# Patient Record
Sex: Female | Born: 1965 | Race: White | Hispanic: No | Marital: Single | Smoking: Never smoker
Health system: Southern US, Community
[De-identification: ages and names within clinical notes are randomized; demographics above are authoritative.]

## PROBLEM LIST (undated history)

## (undated) DIAGNOSIS — C801 Malignant (primary) neoplasm, unspecified: Secondary | ICD-10-CM

## (undated) DIAGNOSIS — I10 Essential (primary) hypertension: Secondary | ICD-10-CM

## (undated) HISTORY — PX: ERCP: SHX60

## (undated) HISTORY — PX: CHOLECYSTECTOMY: SHX55

---

## 2015-03-21 ENCOUNTER — Other Ambulatory Visit: Payer: Self-pay | Admitting: Family Medicine

## 2015-03-21 DIAGNOSIS — Z1231 Encounter for screening mammogram for malignant neoplasm of breast: Secondary | ICD-10-CM

## 2015-03-31 ENCOUNTER — Ambulatory Visit
Admission: RE | Admit: 2015-03-31 | Discharge: 2015-03-31 | Disposition: A | Discharge status: Home / Self Care | Payer: BC Managed Care – PPO | Source: Ambulatory Visit | Attending: Family Medicine | Admitting: Family Medicine

## 2015-03-31 DIAGNOSIS — Z1231 Encounter for screening mammogram for malignant neoplasm of breast: Secondary | ICD-10-CM | POA: Insufficient documentation

## 2015-03-31 HISTORY — DX: Malignant (primary) neoplasm, unspecified: C80.1

## 2016-07-08 ENCOUNTER — Other Ambulatory Visit: Payer: Self-pay | Admitting: Family Medicine

## 2016-07-08 DIAGNOSIS — Z1231 Encounter for screening mammogram for malignant neoplasm of breast: Secondary | ICD-10-CM

## 2016-07-26 ENCOUNTER — Ambulatory Visit
Admission: RE | Admit: 2016-07-26 | Discharge: 2016-07-26 | Disposition: A | Discharge status: Home / Self Care | Payer: BC Managed Care – PPO | Source: Ambulatory Visit | Attending: Family Medicine | Admitting: Family Medicine

## 2016-07-26 DIAGNOSIS — Z1231 Encounter for screening mammogram for malignant neoplasm of breast: Secondary | ICD-10-CM | POA: Diagnosis not present

## 2017-01-03 ENCOUNTER — Emergency Department: Payer: BC Managed Care – PPO

## 2017-01-03 ENCOUNTER — Emergency Department
Admission: EM | Admit: 2017-01-03 | Discharge: 2017-01-03 | Disposition: A | Discharge status: Home / Self Care | Payer: BC Managed Care – PPO | Attending: Emergency Medicine | Admitting: Emergency Medicine

## 2017-01-03 DIAGNOSIS — Y999 Unspecified external cause status: Secondary | ICD-10-CM | POA: Diagnosis not present

## 2017-01-03 DIAGNOSIS — S199XXA Unspecified injury of neck, initial encounter: Secondary | ICD-10-CM | POA: Diagnosis present

## 2017-01-03 DIAGNOSIS — Y939 Activity, unspecified: Secondary | ICD-10-CM | POA: Diagnosis not present

## 2017-01-03 DIAGNOSIS — S161XXA Strain of muscle, fascia and tendon at neck level, initial encounter: Secondary | ICD-10-CM | POA: Insufficient documentation

## 2017-01-03 DIAGNOSIS — S39012A Strain of muscle, fascia and tendon of lower back, initial encounter: Secondary | ICD-10-CM | POA: Insufficient documentation

## 2017-01-03 DIAGNOSIS — Y92410 Unspecified street and highway as the place of occurrence of the external cause: Secondary | ICD-10-CM | POA: Insufficient documentation

## 2017-01-03 MED ORDER — KETOROLAC TROMETHAMINE 60 MG/2ML IM SOLN
60.0000 mg | Freq: Once | INTRAMUSCULAR | Status: AC
  Administered 2017-01-03: 60 mg via INTRAMUSCULAR
  Filled 2017-01-03: qty 2

## 2017-01-03 MED ORDER — MELOXICAM 15 MG PO TABS: 15.0000 mg | ORAL_TABLET | Freq: Every day | ORAL | 0 refills | Status: AC

## 2017-01-03 MED ORDER — METHOCARBAMOL 500 MG PO TABS: 500.0000 mg | ORAL_TABLET | Freq: Four times a day (QID) | ORAL | 0 refills | Status: AC

## 2017-01-03 MED ORDER — ORPHENADRINE CITRATE 30 MG/ML IJ SOLN
60.0000 mg | Freq: Once | INTRAMUSCULAR | Status: AC
  Administered 2017-01-03: 60 mg via INTRAMUSCULAR
  Filled 2017-01-03: qty 2

## 2017-01-03 NOTE — ED Notes (Signed)
Pt states right thigh is tingling feels like it is asleep.
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
## 2017-04-12 ENCOUNTER — Encounter: Payer: Self-pay | Admitting: Emergency Medicine

## 2017-04-12 ENCOUNTER — Other Ambulatory Visit: Payer: Self-pay

## 2017-04-12 ENCOUNTER — Inpatient Hospital Stay
Admission: EM | Admit: 2017-04-12 | Discharge: 2017-04-12 | DRG: 445 | Disposition: A | Discharge status: Other Acute Inpatient Hospital | Payer: BC Managed Care – PPO | Attending: Surgery | Admitting: Surgery

## 2017-04-12 ENCOUNTER — Emergency Department: Payer: BC Managed Care – PPO

## 2017-04-12 DIAGNOSIS — K819 Cholecystitis, unspecified: Secondary | ICD-10-CM

## 2017-04-12 DIAGNOSIS — K3533 Acute appendicitis with perforation and localized peritonitis, with abscess: Secondary | ICD-10-CM | POA: Diagnosis present

## 2017-04-12 DIAGNOSIS — Z6841 Body Mass Index (BMI) 40.0 and over, adult: Secondary | ICD-10-CM | POA: Diagnosis not present

## 2017-04-12 DIAGNOSIS — Z85828 Personal history of other malignant neoplasm of skin: Secondary | ICD-10-CM

## 2017-04-12 DIAGNOSIS — K8051 Calculus of bile duct without cholangitis or cholecystitis with obstruction: Principal | ICD-10-CM | POA: Diagnosis present

## 2017-04-12 DIAGNOSIS — R1011 Right upper quadrant pain: Secondary | ICD-10-CM | POA: Diagnosis present

## 2017-04-12 DIAGNOSIS — Z79899 Other long term (current) drug therapy: Secondary | ICD-10-CM | POA: Diagnosis not present

## 2017-04-12 LAB — COMPREHENSIVE METABOLIC PANEL
ALT: 301 U/L — ABNORMAL HIGH (ref 14–54)
AST: 163 U/L — ABNORMAL HIGH (ref 15–41)
Albumin: 3.5 g/dL (ref 3.5–5.0)
Alkaline Phosphatase: 156 U/L — ABNORMAL HIGH (ref 38–126)
Anion gap: 11 (ref 5–15)
BUN: 13 mg/dL (ref 6–20)
CO2: 24 mmol/L (ref 22–32)
Calcium: 9 mg/dL (ref 8.9–10.3)
Chloride: 102 mmol/L (ref 101–111)
Creatinine, Ser: 0.84 mg/dL (ref 0.44–1.00)
GFR calc Af Amer: >60 mL/min (ref >60)
GFR calc non Af Amer: >60 mL/min (ref >60)
Glucose, Bld: 144 mg/dL — ABNORMAL HIGH (ref 65–99)
Potassium: 2.8 mmol/L — ABNORMAL LOW (ref 3.5–5.1)
Sodium: 137 mmol/L (ref 135–145)
Total Bilirubin: 2.5 mg/dL — ABNORMAL HIGH (ref 0.3–1.2)
Total Protein: 7.8 g/dL (ref 6.5–8.1)

## 2017-04-12 LAB — CBC WITH DIFFERENTIAL/PLATELET
Basophils Absolute: 0 10*3/uL (ref 0–0.1)
Basophils Relative: 0 %
Eosinophils Absolute: 0 10*3/uL (ref 0–0.7)
Eosinophils Relative: 0 %
HCT: 39.4 % (ref 35.0–47.0)
Hemoglobin: 12.9 g/dL (ref 12.0–16.0)
Lymphocytes Relative: 9 %
Lymphs Abs: 0.7 10*3/uL — ABNORMAL LOW (ref 1.0–3.6)
MCH: 28.6 pg (ref 26.0–34.0)
MCHC: 32.8 g/dL (ref 32.0–36.0)
MCV: 87.3 fL (ref 80.0–100.0)
Monocytes Absolute: 0.5 10*3/uL (ref 0.2–0.9)
Monocytes Relative: 6 %
Neutro Abs: 6.5 10*3/uL (ref 1.4–6.5)
Neutrophils Relative %: 85 %
Platelets: 256 10*3/uL (ref 150–440)
RBC: 4.52 MIL/uL (ref 3.80–5.20)
RDW: 14 % (ref 11.5–14.5)
WBC: 7.7 10*3/uL (ref 3.6–11.0)

## 2017-04-12 LAB — LIPASE, BLOOD: Lipase: 3474 U/L — ABNORMAL HIGH (ref 11–51)

## 2017-04-12 LAB — TROPONIN I: Troponin I: <0.03 ng/mL (ref <0.03)

## 2017-04-12 LAB — LACTIC ACID, PLASMA: Lactic Acid, Venous: 1.6 mmol/L (ref 0.5–1.9)

## 2017-04-12 MED ORDER — SODIUM CHLORIDE 0.9 % IV BOLUS (SEPSIS)
1000.0000 mL | Freq: Once | INTRAVENOUS | Status: AC
  Administered 2017-04-12: 1000 mL via INTRAVENOUS

## 2017-04-12 MED ORDER — PROMETHAZINE HCL 25 MG/ML IJ SOLN
12.5000 mg | Freq: Once | INTRAMUSCULAR | Status: AC
  Administered 2017-04-12: 12.5 mg via INTRAVENOUS
  Filled 2017-04-12: qty 1

## 2017-04-12 MED ORDER — METRONIDAZOLE IN NACL 5-0.79 MG/ML-% IV SOLN
500.0000 mg | Freq: Once | INTRAVENOUS | Status: AC
  Administered 2017-04-12: 500 mg via INTRAVENOUS
  Filled 2017-04-12: qty 100

## 2017-04-12 MED ORDER — MORPHINE SULFATE (PF) 4 MG/ML IV SOLN
4.0000 mg | Freq: Once | INTRAVENOUS | Status: AC
  Administered 2017-04-12: 4 mg via INTRAVENOUS
  Filled 2017-04-12: qty 1

## 2017-04-12 MED ORDER — PROMETHAZINE HCL 25 MG/ML IJ SOLN
INTRAMUSCULAR | Status: AC
  Administered 2017-04-12: 12.5 mg via INTRAVENOUS
  Filled 2017-04-12: qty 1

## 2017-04-12 MED ORDER — ONDANSETRON HCL 4 MG/2ML IJ SOLN
4.0000 mg | Freq: Once | INTRAMUSCULAR | Status: AC
  Administered 2017-04-12: 4 mg via INTRAVENOUS
  Filled 2017-04-12: qty 2

## 2017-04-12 MED ORDER — CIPROFLOXACIN IN D5W 400 MG/200ML IV SOLN
400.0000 mg | Freq: Once | INTRAVENOUS | Status: AC
  Administered 2017-04-12: 400 mg via INTRAVENOUS
  Filled 2017-04-12: qty 200

## 2017-04-12 NOTE — ED Triage Notes (Signed)
Pt to ed with c/o right side upper abd pain.  States had labs drawn at MD office yesterday.  Reports elevated liver enzymes.

## 2017-04-12 NOTE — ED Notes (Signed)
Patient left via [employer]

## 2017-04-12 NOTE — ED Notes (Signed)
emtala reviewed by this RN 

## 2017-04-12 NOTE — Consult Note (Signed)
Patient ID: Eileen Wu, female   DOB: 08/14/1965, 52 y.o.   MRN: [id number]  HPI Eileen Wu is a 52 y.o. female orbitally obese female presented with an acute onset of right upper quadrant and epigastric pain that is started 3 days ago.  The patient reports that the pain is worsening over the last 2 days and now is severe and constant.  She went to [facility] clinic yesterday where they had LFTs done revealing increasing the bilirubin, alkaline phosphatase and LFTs.  Moreover she reports that over the last couple of days she had acholia and darker urine.  She developed fevers and chills.  She is super morbidly obese with a BMI of 46 but she is able to perform more than 4 metastases of activity without any shortness of breath or chest pain.  She does have an undiagnosed and untreated sleep apnea. Ultrasound personally reviewed there is evidence of cholecystitis with gallstones.  Normal common bile duct.  Repeated LFTs show worsening in the bilirubin as well as increase in the alkaline phosphatase.  HPI  Past Medical History:  Diagnosis Date  . Cancer ([place])    skin    History reviewed. No pertinent surgical history.  Family History  Problem Relation Age of Onset  . Breast cancer Maternal Aunt     Social History Social History   Tobacco Use  . Smoking status: Never Smoker  . Smokeless tobacco: Never Used  Substance Use Topics  . Alcohol use: No    Frequency: Never  . Drug use: No    Allergies  Allergen Reactions  . Penicillins Hives    Has patient had a PCN reaction causing immediate rash, facial/tongue/throat swelling, SOB or lightheadedness with hypotension: Unknown Has patient had a PCN reaction causing severe rash involving mucus membranes or skin necrosis: Unknown Has patient had a PCN reaction that required hospitalization: Unknown Has patient had a PCN reaction occurring within the last 10 years: Unknown If all of the above answers are "NO", then may proceed with Cephalosporin  use.   . Sulfa Antibiotics     Current Facility-Administered Medications  Medication Dose Route Frequency Provider Last Rate Last Dose  . ciprofloxacin (CIPRO) IVPB 400 mg  400 mg Intravenous Once Nena Polio, MD 200 mL/hr at 04/12/17 1009 400 mg at 04/12/17 1009  . metroNIDAZOLE (FLAGYL) IVPB 500 mg  500 mg Intravenous Once Nena Polio, MD 100 mL/hr at 04/12/17 1022 500 mg at 04/12/17 1022  . morphine 4 MG/ML injection 4 mg  4 mg Intravenous Once Conni Slipper F, MD      . sodium chloride 0.9 % bolus 1,000 mL  1,000 mL Intravenous Once Dagon Budai F, MD       Current Outpatient Medications  Medication Sig Dispense Refill  . hydrochlorothiazide (HYDRODIURIL) 25 MG tablet Take 25 mg by mouth daily.    Marland Kitchen losartan (COZAAR) 25 MG tablet Take 25 mg by mouth daily.    . meloxicam (MOBIC) 15 MG tablet Take 1 tablet (15 mg total) by mouth daily. (Patient not taking: Reported on 04/12/2017) 30 tablet 0  . methocarbamol (ROBAXIN) 500 MG tablet Take 1 tablet (500 mg total) by mouth 4 (four) times daily. (Patient not taking: Reported on 04/12/2017) 16 tablet 0     Review of Systems Full ROS  was asked and was negative except for the information on the HPI  Physical Exam Blood pressure 115/82, pulse 65, temperature 98.3 F (36.8 C), temperature source Oral, resp. rate 16,  height 5\' 7"  (1.702 m), weight 132.9 kg (293 lb), last menstrual period 03/11/2017, SpO2 98 %. CONSTITUTIONAL: Obese in pain EYES: Pupils are equal, round, and reactive to light, Sclera are non-icteric. EARS, NOSE, MOUTH AND THROAT: The oropharynx is clear. The oral mucosa is pink and moist. Hearing is intact to voice. LYMPH NODES:  Lymph nodes in the neck are normal. RESPIRATORY:  Lungs are clear. There is normal respiratory effort, with equal breath sounds bilaterally, and without pathologic use of accessory muscles. CARDIOVASCULAR: Heart is regular without murmurs, gallops, or rubs. GI: The abdomen is  Soft, slightly  tender to palpation in the right upper quadrant positive Murphy sign.  No evidence of peritonitis  Large panus GU: Rectal deferred.   MUSCULOSKELETAL: Normal muscle strength and tone. No cyanosis or edema.   SKIN: Turgor is good and there are no pathologic skin lesions or ulcers. NEUROLOGIC: Motor and sensation is grossly normal. Cranial nerves are grossly intact. PSYCH:  Oriented to person, place and time. Affect is normal.  Data Reviewed  I have personally reviewed the patient's imaging, laboratory findings and medical records.    Assessment/Plan   52 year old female with classic signs and symptoms of acute cholecystitis with biliary obstruction confirmed by elevation of alkaline phosphatase, bilirubin and LFTs. Unfortunately Dr.Wohl is the only one that does ERCPs at [facility] is not a call over the weekend therefore this patient will need to be transferred to a center that provides an ERCP in an expedited fashion. Just with the patient and with Dr. Rip Harbour in detail about my thought process. He will transfer the patient either to[facility] in [place] or to [place]. We Have started aggressive fluid resuscitation and broad-spectrum antibiotics Need for emergent surgical intervention at this time. Please note that today's encounter require high complexity medical decision making process due to her acuity of her disease, need for transferring to a tertiary facility and assessment for the need for emergent surgical intervention.     Caroleen Hamman, MD FACS General Surgeon 04/12/2017, 10:45 AM

## 2017-04-12 NOTE — Discharge Instructions (Signed)
NOT PRINTED FOR PATIENT. PRINTED DUE TO HARD STOP IN Epic SOFTWARE WHICH DID NOT ALLOW PATIENT TO BE DISCHARGED FROM THE BOARD.

## 2017-04-12 NOTE — ED Notes (Signed)
Report given to [place] at [facility].

## 2017-04-12 NOTE — ED Notes (Signed)
[facility] here to transfer the patient to [facility]. Patient signed paper copy to give consent for transfer. Patient ambulated with a steady gait to room commode. Patient declined pain medicine for discharge. Patient is calm and cooperative at this time. Family at bedside.

## 2017-04-12 NOTE — ED Notes (Addendum)
Report given to [employer]. ETA approximately 30 minutes.

## 2017-04-12 NOTE — ED Notes (Signed)
Report given to transfer center for [place].

## 2017-04-12 NOTE — ED Provider Notes (Addendum)
[facility] Emergency Department Provider Note   ____________________________________________   First MD Initiated Contact with Patient 04/12/17 [phone]     (approximate)  I have reviewed the triage vital signs and the nursing notes.   HISTORY  Chief Complaint Abdominal Pain    HPI Eileen Wu is a 52 y.o. female Who reports she's had right upper quadrant pain since Wednesday. pain is dull and achy. She went to see the doctor yesterday due to liver function test and found that were elevated. She denies any alcohol use she denies using more than just a few Tylenol pills. She does get ultrasound today. She developed sudden onset of severe pain in right upper quadrant this morning and came in here. Her doctor had called to request an ultrasound.  review of her records show that she has had paler stool and darker urine recently. She has a history of obesity and hypertension. Past Medical History:  Diagnosis Date  . Cancer ([place])    skin    There are no active problems to display for this patient.   History reviewed. No pertinent surgical history.  Prior to Admission medications   Medication Sig Start Date End Date Taking? Authorizing Provider  meloxicam (MOBIC) 15 MG tablet Take 1 tablet (15 mg total) by mouth daily. 01/03/17   Cuthriell, Charline Bills, PA-C  methocarbamol (ROBAXIN) 500 MG tablet Take 1 tablet (500 mg total) by mouth 4 (four) times daily. 01/03/17   Cuthriell, Charline Bills, PA-C    Allergies Penicillins and Sulfa antibiotics  Family History  Problem Relation Age of Onset  . Breast cancer Maternal Aunt   family history significant for multiple relatives with gallbladder disease.  Social History Social History   Tobacco Use  . Smoking status: Never Smoker  . Smokeless tobacco: Never Used  Substance Use Topics  . Alcohol use: No    Frequency: Never  . Drug use: No    Review of Systems  Constitutional: No fever/chills Eyes: No  visual changes. ENT: No sore throat. Cardiovascular: Denies chest pain. Respiratory: Denies shortness of breath. Gastrointestinal: right upper quadrant abdominal pain nausea, no vomiting.  No diarrhea.  No constipation. Genitourinary: Negative for dysuria. Musculoskeletal: Negative for back pain. Skin: Negative for rash. Neurological: Negative for headaches, focal weakness   ____________________________________________   PHYSICAL EXAM:  VITAL SIGNS: ED Triage Vitals  Enc Vitals Group     BP 04/12/17 0743 115/82     Pulse Rate 04/12/17 0743 65     Resp 04/12/17 0743 16     Temp 04/12/17 0743 98.3 F (36.8 C)     Temp Source 04/12/17 0743 Oral     SpO2 04/12/17 0743 98 %     Weight 04/12/17 0745 293 lb (132.9 kg)     Height 04/12/17 0745 5\' 7"  (1.702 m)     Head Circumference --      Peak Flow --      Pain Score 04/12/17 0745 10     Pain Loc --      Pain Edu? --      Excl. in [place]? --    Constitutional: Alert and oriented. Ill appearing and in  acute distress. Eyes: Conjunctivae are normal.  Head: Atraumatic. Nose: No congestion/rhinnorhea. Mouth/Throat: Mucous membranes are moist.  Oropharynx non-erythematous. Neck: No stridor.  Cardiovascular: Normal rate, regular rhythm. Grossly normal heart sounds.  Good peripheral circulation. Respiratory: Normal respiratory effort.  No retractions. Lungs CTAB. Gastrointestinal: Soft tender to palpation percussion in  right upper quadrant. No distention. No abdominal bruits. No CVA tenderness. Musculoskeletal: No lower extremity tenderness nor edema.  No joint effusions. Neurologic:  Normal speech and language. No gross focal neurologic deficits are appreciated.  Skin:  Skin is warm, dry and intact. No rash noted. Psychiatric: Mood and affect are normal. Speech and behavior are normal.  ____________________________________________   LABS (all labs ordered are listed, but only abnormal results are displayed)  Labs Reviewed  CBC  WITH DIFFERENTIAL/PLATELET - Abnormal; Notable for the following components:      Result Value   Lymphs Abs 0.7 (*)    All other components within normal limits  LACTIC ACID, PLASMA  COMPREHENSIVE METABOLIC PANEL  LIPASE, BLOOD  LACTIC ACID, PLASMA  TROPONIN I   ____________________________________________  EKG EKG read and interpreted by me shows normal sinus rhythm rate of 70 normal axis no acute ST-T wave changes ____________________________________________  RADIOLOGY  ED MD interpretation:  ultrasound read by radiology as acute cholecystitis  Official radiology report(s): US Abdomen Limited  Result Date: 04/12/2017 CLINICAL DATA:  Elevated LFTs.  Epigastric pain. EXAM: ULTRASOUND ABDOMEN LIMITED RIGHT UPPER QUADRANT COMPARISON:  None. FINDINGS: Gallbladder: There is cholelithiasis. The gallbladder is mildly thickened measuring 3.5 mm. A positive Murphy's sign is reported. No pericholecystic fluid. Common bile duct: Diameter: 5 mm Liver: Hepatic steatosis. No focal mass. Portal vein is patent on color Doppler imaging with normal direction of blood flow towards the liver. IMPRESSION: 1. Cholelithiasis, mild gallbladder wall thickening, and a Murphy's sign. The findings are concerning for acute cholecystitis. 2. Hepatic steatosis. Electronically Signed   By: Dorise Bullion III M.D   On: 04/12/2017 09:38    ____________________________________________   PROCEDURES  Procedure(s) performed:   Procedures  Critical Care performed:   ____________________________________________   INITIAL IMPRESSION / ASSESSMENT AND PLAN / ED COURSE  patient has not eaten since yesterday drank a little bit of ginger ale is morning but has vomited up. She is still having pain.liver functions are still pending  Dr. Adora Fridge comes down to see the patient but we do not have anybody onll who can do ERCP therefore he asked me to tr caa   nsfer the patient patient asked to go to [facility] or if they don't  have any beds then [facility] and if they don't have any beds then [facility].   [facility] accepted her but doesn't have any beds. I called [place] factor mmHg or D will see her there and consult but I have to get the hospitalist to call me back to accept her in transfer   ____________________________________________   FINAL CLINICAL IMPRESSION(S) / ED DIAGNOSES  Final diagnoses:  Cholecystitis     ED Discharge Orders    None       Note:  This document was prepared using Dragon voice recognition software and may include unintentional dictation errors.    Nena Polio, MD 04/12/17 1000    Nena Polio, MD 04/12/17 1113    Nena Polio, MD 04/12/17 [phone]

## 2017-04-12 NOTE — ED Notes (Signed)
Patient given lemon swabs for comfort. 

## 2017-04-12 NOTE — ED Notes (Signed)
Approximately one hour before [facility] transport arrives.

## 2017-04-23 ENCOUNTER — Ambulatory Visit
Admission: RE | Admit: 2017-04-23 | Payer: BC Managed Care – PPO | Source: Ambulatory Visit | Admitting: Internal Medicine

## 2017-04-23 ENCOUNTER — Encounter: Admission: RE | Payer: Self-pay | Source: Ambulatory Visit

## 2017-04-23 SURGERY — COLONOSCOPY WITH PROPOFOL
Anesthesia: General

## 2017-05-23 ENCOUNTER — Other Ambulatory Visit: Payer: Self-pay | Admitting: Family Medicine

## 2017-05-23 ENCOUNTER — Ambulatory Visit
Admission: RE | Admit: 2017-05-23 | Discharge: 2017-05-23 | Disposition: A | Discharge status: Home / Self Care | Payer: BC Managed Care – PPO | Source: Ambulatory Visit | Attending: Family Medicine | Admitting: Family Medicine

## 2017-05-23 DIAGNOSIS — W231XXA Caught, crushed, jammed, or pinched between stationary objects, initial encounter: Secondary | ICD-10-CM | POA: Insufficient documentation

## 2017-05-23 DIAGNOSIS — S61211A Laceration without foreign body of left index finger without damage to nail, initial encounter: Secondary | ICD-10-CM

## 2017-05-23 DIAGNOSIS — M7989 Other specified soft tissue disorders: Secondary | ICD-10-CM | POA: Diagnosis not present

## 2017-11-03 ENCOUNTER — Other Ambulatory Visit: Payer: Self-pay | Admitting: Family Medicine

## 2017-11-03 ENCOUNTER — Other Ambulatory Visit: Payer: Self-pay | Admitting: Obstetrics and Gynecology

## 2017-11-03 DIAGNOSIS — Z1231 Encounter for screening mammogram for malignant neoplasm of breast: Secondary | ICD-10-CM

## 2017-11-05 ENCOUNTER — Ambulatory Visit
Admission: RE | Admit: 2017-11-05 | Discharge: 2017-11-05 | Disposition: A | Discharge status: Home / Self Care | Payer: BC Managed Care – PPO | Source: Ambulatory Visit | Attending: Obstetrics and Gynecology | Admitting: Obstetrics and Gynecology

## 2017-11-05 DIAGNOSIS — Z1231 Encounter for screening mammogram for malignant neoplasm of breast: Secondary | ICD-10-CM | POA: Insufficient documentation

## 2018-02-17 ENCOUNTER — Encounter: Payer: Self-pay | Admitting: Student

## 2018-02-18 ENCOUNTER — Ambulatory Visit
Admission: RE | Admit: 2018-02-18 | Discharge: 2018-02-18 | Disposition: A | Discharge status: Home / Self Care | Payer: BC Managed Care – PPO | Attending: Internal Medicine | Admitting: Internal Medicine

## 2018-02-18 ENCOUNTER — Ambulatory Visit: Payer: BC Managed Care – PPO | Admitting: Anesthesiology

## 2018-02-18 ENCOUNTER — Encounter
Admission: RE | Disposition: A | Discharge status: Home / Self Care | Payer: Self-pay | Source: Home / Self Care | Attending: Internal Medicine

## 2018-02-18 DIAGNOSIS — Z1211 Encounter for screening for malignant neoplasm of colon: Secondary | ICD-10-CM | POA: Diagnosis not present

## 2018-02-18 DIAGNOSIS — Z882 Allergy status to sulfonamides status: Secondary | ICD-10-CM | POA: Diagnosis not present

## 2018-02-18 DIAGNOSIS — Z791 Long term (current) use of non-steroidal anti-inflammatories (NSAID): Secondary | ICD-10-CM | POA: Insufficient documentation

## 2018-02-18 DIAGNOSIS — Z6841 Body Mass Index (BMI) 40.0 and over, adult: Secondary | ICD-10-CM | POA: Insufficient documentation

## 2018-02-18 DIAGNOSIS — I10 Essential (primary) hypertension: Secondary | ICD-10-CM | POA: Diagnosis not present

## 2018-02-18 DIAGNOSIS — Z86010 Personal history of colonic polyps: Secondary | ICD-10-CM | POA: Diagnosis not present

## 2018-02-18 DIAGNOSIS — K5730 Diverticulosis of large intestine without perforation or abscess without bleeding: Secondary | ICD-10-CM | POA: Insufficient documentation

## 2018-02-18 DIAGNOSIS — Z79899 Other long term (current) drug therapy: Secondary | ICD-10-CM | POA: Diagnosis not present

## 2018-02-18 DIAGNOSIS — K640 First degree hemorrhoids: Secondary | ICD-10-CM | POA: Insufficient documentation

## 2018-02-18 DIAGNOSIS — Z8371 Family history of colonic polyps: Secondary | ICD-10-CM | POA: Diagnosis not present

## 2018-02-18 HISTORY — DX: Essential (primary) hypertension: I10

## 2018-02-18 HISTORY — DX: Morbid (severe) obesity due to excess calories: E66.01

## 2018-02-18 HISTORY — PX: COLONOSCOPY WITH PROPOFOL: SHX5780

## 2018-02-18 LAB — POCT PREGNANCY, URINE: Preg Test, Ur: NEGATIVE

## 2018-02-18 SURGERY — COLONOSCOPY WITH PROPOFOL
Anesthesia: General

## 2018-02-18 MED ORDER — PROPOFOL 500 MG/50ML IV EMUL
INTRAVENOUS | Status: DC | PRN
  Administered 2018-02-18: 100 ug/kg/min via INTRAVENOUS

## 2018-02-18 MED ORDER — SODIUM CHLORIDE 0.9 % IV SOLN
INTRAVENOUS | Status: DC
  Administered 2018-02-18 (×2): via INTRAVENOUS

## 2018-02-18 NOTE — Anesthesia Preprocedure Evaluation (Signed)
Anesthesia Evaluation  Patient identified by MRN, date of birth, ID band Patient awake    Reviewed: Allergy & Precautions, H&P , NPO status , Patient's Chart, lab work & pertinent test results  History of Anesthesia Complications Negative for: history of anesthetic complications  Airway Mallampati: III  TM Distance: >3 FB Neck ROM: full    Dental  (+) Chipped   Pulmonary neg shortness of breath,  Signs and symptoms suggestive of sleep apnea            Cardiovascular Exercise Tolerance: Good hypertension, (-) angina(-) Past MI and (-) DOE      Neuro/Psych negative neurological ROS  negative psych ROS   GI/Hepatic negative GI ROS, Neg liver ROS, neg GERD  ,  Endo/Other  negative endocrine ROS  Renal/GU negative Renal ROS  negative genitourinary   Musculoskeletal   Abdominal   Peds  Hematology negative hematology ROS (+)   Anesthesia Other Findings Past Medical History: No date: Cancer ([place])     Comment:  skin No date: Hypertension No date: Morbid obesity due to excess calories ([place])  Past Surgical History: No date: CHOLECYSTECTOMY No date: ERCP  BMI    Body Mass Index:  46.20 kg/m      Reproductive/Obstetrics negative OB ROS                             Anesthesia Physical Anesthesia Plan  ASA: III  Anesthesia Plan: General   Post-op Pain Management:    Induction: Intravenous  PONV Risk Score and Plan: Propofol infusion and TIVA  Airway Management Planned: Natural Airway and Nasal Cannula  Additional Equipment:   Intra-op Plan:   Post-operative Plan:   Informed Consent: I have reviewed the patients History and Physical, chart, labs and discussed the procedure including the risks, benefits and alternatives for the proposed anesthesia with the patient or authorized representative who has indicated his/her understanding and acceptance.     Dental Advisory  Given  Plan Discussed with: Anesthesiologist, CRNA and Surgeon  Anesthesia Plan Comments: (Patient consented for risks of anesthesia including but not limited to:  - adverse reactions to medications - risk of intubation if required - damage to teeth, lips or other oral mucosa - sore throat or hoarseness - Damage to heart, brain, lungs or loss of life  Patient voiced understanding.)        Anesthesia Quick Evaluation

## 2018-02-18 NOTE — Op Note (Signed)
[facility] Gastroenterology Patient Name: Taleya Whitcher Procedure Date: 02/18/2018 10:38 AM MRN: [id number] Account #: [id number] Date of Birth: 17-Nov-1965 Admit Type: Outpatient Age: 53 Room: [facility] ENDO ROOM 2 Gender: Female Note Status: Finalized Procedure:            Colonoscopy Indications:          High risk colon cancer surveillance: Personal history                        of colonic polyps Providers:            Benay Pike. [name] MD, MD Referring MD:         Dion Body (Referring MD) Complications:        No immediate complications. Procedure:            Pre-Anesthesia Assessment:                       - The risks and benefits of the procedure and the                        sedation options and risks were discussed with the                        patient. All questions were answered and informed                        consent was obtained.                       - Patient identification and proposed procedure were                        verified prior to the procedure by the nurse. The                        procedure was verified in the procedure room.                       - ASA Grade Assessment: III - A patient with severe                        systemic disease.                       - After reviewing the risks and benefits, the patient                        was deemed in satisfactory condition to undergo the                        procedure.                       After obtaining informed consent, the colonoscope was                        passed under direct vision. Throughout the procedure,                        the patient's blood pressure, pulse, and oxygen  saturations were monitored continuously. The                        Colonoscope was introduced through the anus and                        advanced to the the cecum, identified by appendiceal                        orifice and ileocecal valve. The colonoscopy was                    performed without difficulty. The patient tolerated the                        procedure well. The quality of the bowel preparation                        was excellent. The ileocecal valve, appendiceal                        orifice, and rectum were photographed. The quality of                        the bowel preparation was [Prep Quality]. Findings:      The perianal and digital rectal examinations were normal. Pertinent       negatives include normal sphincter tone and no palpable rectal lesions.      Many small-mouthed diverticula were found in the sigmoid colon.      Non-bleeding internal hemorrhoids were found during retroflexion. The       hemorrhoids were Grade I (internal hemorrhoids that do not prolapse).      The exam was otherwise without abnormality. Impression:           - Diverticulosis in the sigmoid colon.                       - Non-bleeding internal hemorrhoids.                       - The examination was otherwise normal.                       - No specimens collected. Recommendation:       - Patient has a contact number available for                        emergencies. The signs and symptoms of potential                        delayed complications were discussed with the patient.                        Return to normal activities tomorrow. Written discharge                        instructions were provided to the patient.                       - Resume previous diet.                       -  Continue present medications.                       - Repeat colonoscopy in 5 years for screening purposes.                       - Return to GI office PRN.                       - The findings and recommendations were discussed with                        the patient and their family. Procedure Code(s):    --- Professional ---                       D1761, Colorectal cancer screening; colonoscopy on                        individual at high risk Diagnosis  Code(s):    --- Professional ---                       K57.30, Diverticulosis of large intestine without                        perforation or abscess without bleeding                       K64.0, First degree hemorrhoids                       Z86.010, Personal history of colonic polyps CPT copyright 2018 American Medical Association. All rights reserved. The codes documented in this report are preliminary and upon coder review may  be revised to meet current compliance requirements. Efrain Sella MD, MD 02/18/2018 10:59:47 AM This report has been signed electronically. Number of Addenda: 0 Note Initiated On: 02/18/2018 10:38 AM Scope Withdrawal Time: 0 hours 5 minutes 58 seconds  Total Procedure Duration: 0 hours 8 minutes 13 seconds       [facility]

## 2018-02-18 NOTE — Anesthesia Post-op Follow-up Note (Signed)
Anesthesia QCDR form completed.        

## 2018-02-18 NOTE — Anesthesia Postprocedure Evaluation (Signed)
Anesthesia Post Note  Patient: Eileen Wu  Procedure(s) Performed: COLONOSCOPY WITH PROPOFOL (N/A )  Patient location during evaluation: Endoscopy Anesthesia Type: General Level of consciousness: awake and alert Pain management: pain level controlled Vital Signs Assessment: post-procedure vital signs reviewed and stable Respiratory status: spontaneous breathing, nonlabored ventilation, respiratory function stable and patient connected to nasal cannula oxygen Cardiovascular status: blood pressure returned to baseline and stable Postop Assessment: no apparent nausea or vomiting Anesthetic complications: no     Last Vitals:  Vitals:   02/18/18 1104 02/18/18 1105  BP:  92/66  Pulse:  74  Resp:  13  Temp: (!) 36.2 C 37 C  SpO2:  99%    Last Pain:  Vitals:   02/18/18 1139  TempSrc:   PainSc: 0-No pain                 Precious Haws Alanis Clift

## 2018-02-18 NOTE — H&P (Signed)
Outpatient short stay form Pre-procedure 02/18/2018 10:32 AM Eileen Wu Eileen Wu, M.D.  Primary Physician: Meriel Flavors, M.D.  Reason for visit:  Family history of colon polyps  History of present illness:   53  year old patient presenting for family history of polyps. Patient denies any change in bowel habits, rectal bleeding or involuntary weight loss.    Current Facility-Administered Medications:  .  0.9 %  sodium chloride infusion, , Intravenous, Continuous, [place], Benay Pike, MD, Last Rate: 20 mL/hr at 02/18/18 1019  Medications Prior to Admission  Medication Sig Dispense Refill Last Dose  . cetirizine (ZYRTEC) 10 MG tablet Take 10 mg by mouth daily.   Past Week at Unknown time  . EPINEPHrine 0.3 mg/0.3 mL IJ SOAJ injection Inject 0.3 mg into the muscle once.     . hydrochlorothiazide (HYDRODIURIL) 25 MG tablet Take 25 mg by mouth daily.   02/17/2018 at Unknown time  . losartan (COZAAR) 25 MG tablet Take 25 mg by mouth daily.   02/17/2018 at Unknown time  . meloxicam (MOBIC) 15 MG tablet Take 1 tablet (15 mg total) by mouth daily. (Patient not taking: Reported on 04/12/2017) 30 tablet 0 Not Taking at Unknown time  . methocarbamol (ROBAXIN) 500 MG tablet Take 1 tablet (500 mg total) by mouth 4 (four) times daily. (Patient not taking: Reported on 04/12/2017) 16 tablet 0 Not Taking at Unknown time     Allergies  Allergen Reactions  . Penicillins Hives    Has patient had a PCN reaction causing immediate rash, facial/tongue/throat swelling, SOB or lightheadedness with hypotension: Unknown Has patient had a PCN reaction causing severe rash involving mucus membranes or skin necrosis: Unknown Has patient had a PCN reaction that required hospitalization: Unknown Has patient had a PCN reaction occurring within the last 10 years: Unknown If all of the above answers are "NO", then may proceed with Cephalosporin use.   . Sulfa Antibiotics      Past Medical History:  Diagnosis Date  .  Cancer ([place])    skin  . Hypertension   . Morbid obesity due to excess calories ([place])     Review of systems:  Otherwise negative.    Physical Exam  Gen: Alert, oriented. Appears stated age.  HEENT: [place]/AT. PERRLA. Lungs: CTA, no wheezes. CV: RR nl S1, S2. Abd: soft, benign, no masses. BS+ Ext: No edema. Pulses 2+    Planned procedures: Proceed with colonoscopy. The patient understands the nature of the planned procedure, indications, risks, alternatives and potential complications including but not limited to bleeding, infection, perforation, damage to internal organs and possible oversedation/side effects from anesthesia. The patient agrees and gives consent to proceed.  Please refer to procedure notes for findings, recommendations and patient disposition/instructions.     Eileen Wu Eileen Wu, M.D. Gastroenterology 02/18/2018  10:32 AM

## 2018-02-18 NOTE — Interval H&P Note (Signed)
History and Physical Interval Note:  02/18/2018 10:33 AM  Eileen Wu  has presented today for surgery, with the diagnosis of FAMILY HX COLON POLYPS. COLON SCREENING  The various methods of treatment have been discussed with the patient and family. After consideration of risks, benefits and other options for treatment, the patient has consented to  Procedure(s): COLONOSCOPY WITH PROPOFOL (N/A) as a surgical intervention .  The patient's history has been reviewed, patient examined, no change in status, stable for surgery.  I have reviewed the patient's chart and labs.  Questions were answered to the patient's satisfaction.     [place], [place]

## 2018-02-18 NOTE — Transfer of Care (Signed)
Immediate Anesthesia Transfer of Care Note  Patient: Eileen Wu  Procedure(s) Performed: COLONOSCOPY WITH PROPOFOL (N/A )  Patient Location: PACU  Anesthesia Type:General  Level of Consciousness: awake and alert   Airway & Oxygen Therapy: Patient Spontanous Breathing  Post-op Assessment: Report given to RN  Post vital signs: Reviewed and stable  Last Vitals:  Vitals Value Taken Time  BP 92/66 02/18/2018 11:03 AM  Temp 36.2 C 02/18/2018 11:02 AM  Pulse 70 02/18/2018 11:03 AM  Resp 12 02/18/2018 11:03 AM  SpO2 99 % 02/18/2018 11:03 AM  Vitals shown include unvalidated device data.  Last Pain:  Vitals:   02/18/18 1005  TempSrc: Tympanic         Complications: No apparent anesthesia complications

## 2018-10-21 ENCOUNTER — Other Ambulatory Visit: Payer: Self-pay | Admitting: Obstetrics and Gynecology

## 2018-10-21 DIAGNOSIS — Z1231 Encounter for screening mammogram for malignant neoplasm of breast: Secondary | ICD-10-CM

## 2018-12-02 ENCOUNTER — Ambulatory Visit
Admission: RE | Admit: 2018-12-02 | Discharge: 2018-12-02 | Disposition: A | Discharge status: Home / Self Care | Payer: BC Managed Care – PPO | Source: Ambulatory Visit | Attending: Obstetrics and Gynecology | Admitting: Obstetrics and Gynecology

## 2018-12-02 DIAGNOSIS — Z1231 Encounter for screening mammogram for malignant neoplasm of breast: Secondary | ICD-10-CM | POA: Diagnosis not present

## 2019-06-13 IMAGING — US US ABDOMEN LIMITED
1 series · 14 of 25 positions shown · non-contrast
Comparison: None.

CLINICAL DATA: Elevated LFTs.  Epigastric pain.

EXAM:
ULTRASOUND ABDOMEN LIMITED RIGHT UPPER QUADRANT

[Series 1: us abdomen limited · 0.25mm/px · 14 of 56 slices shown]
[im 1/56]
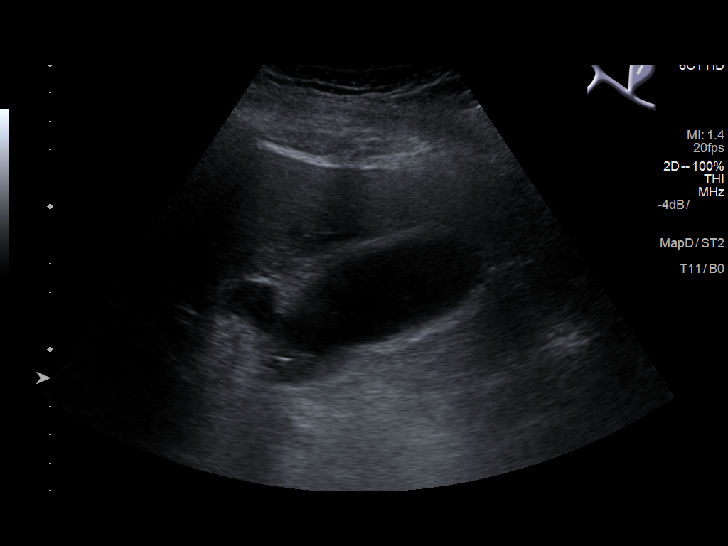
[im 5/56]
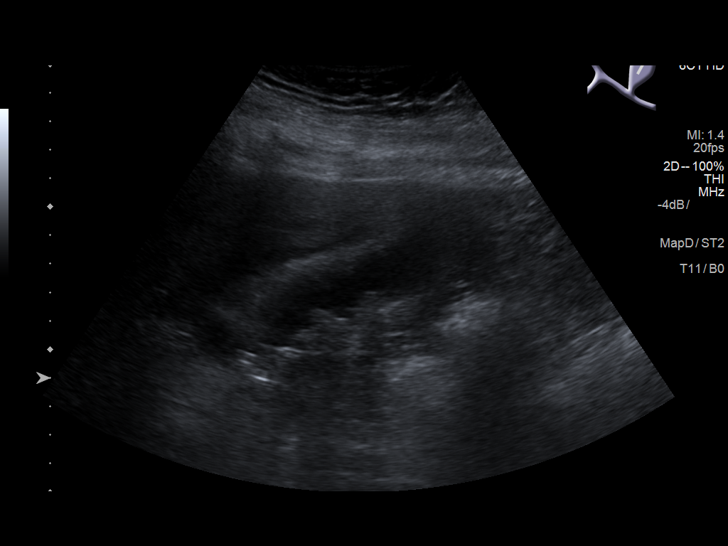
[im 10/56]
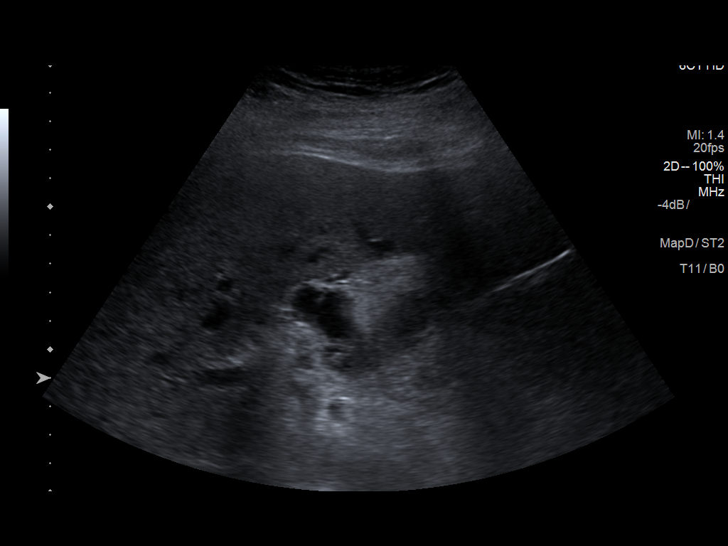
[im 14/56]
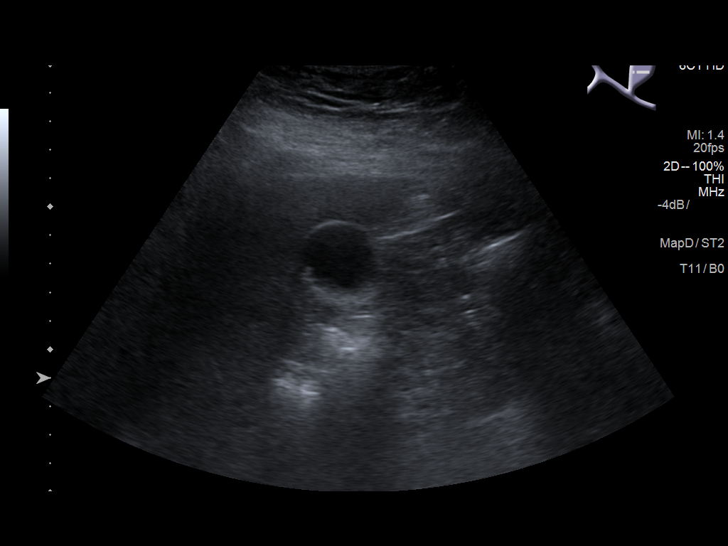
[im 19/56]
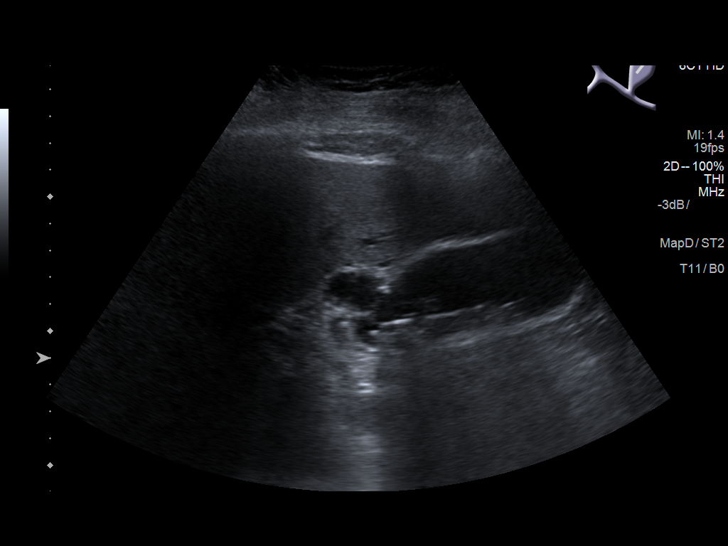
[im 21/56]
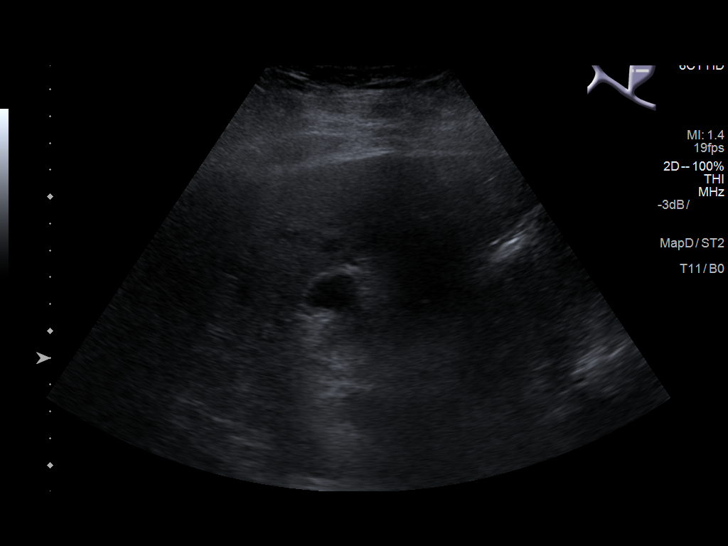
[im 26/56]
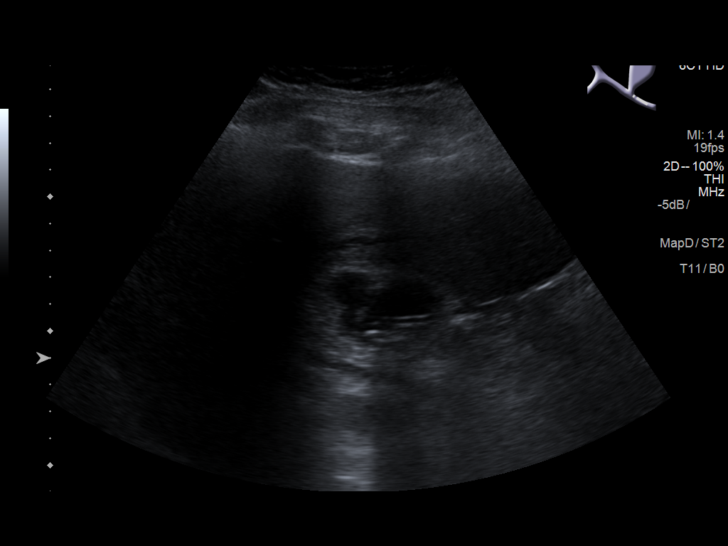
[im 30/56]
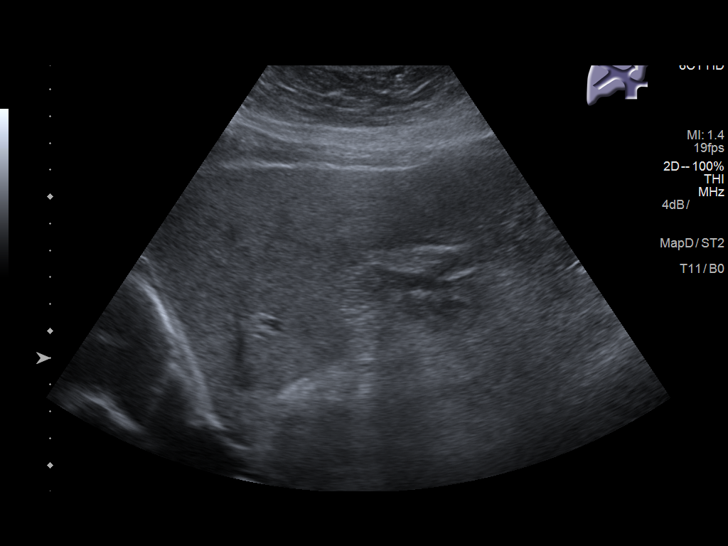
[im 35/56]
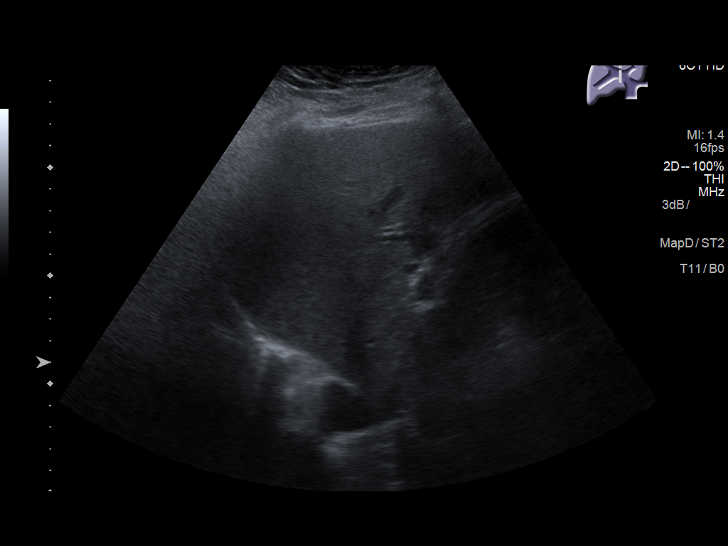
[im 37/56]
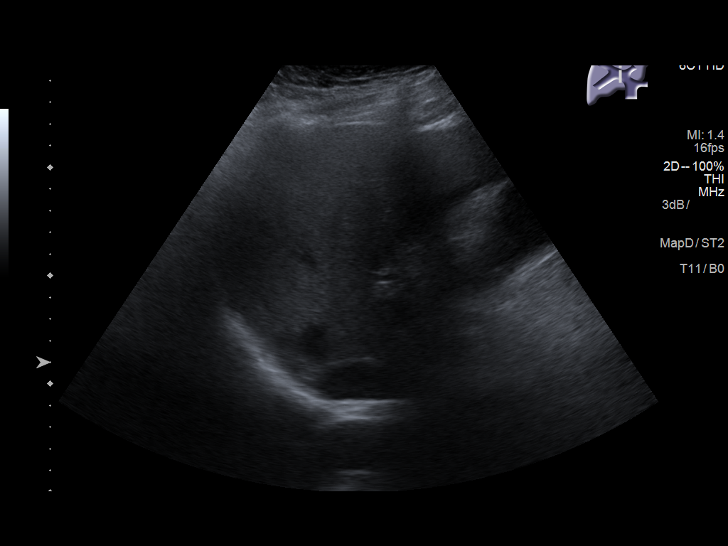
[im 42/56]
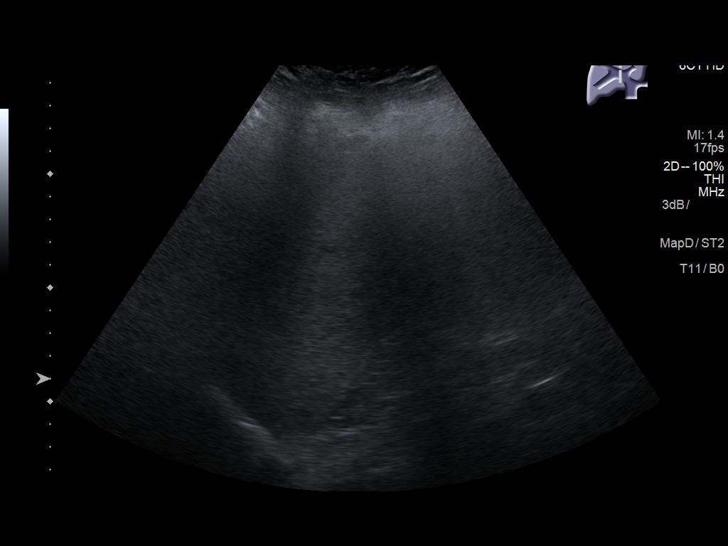
[im 46/56]
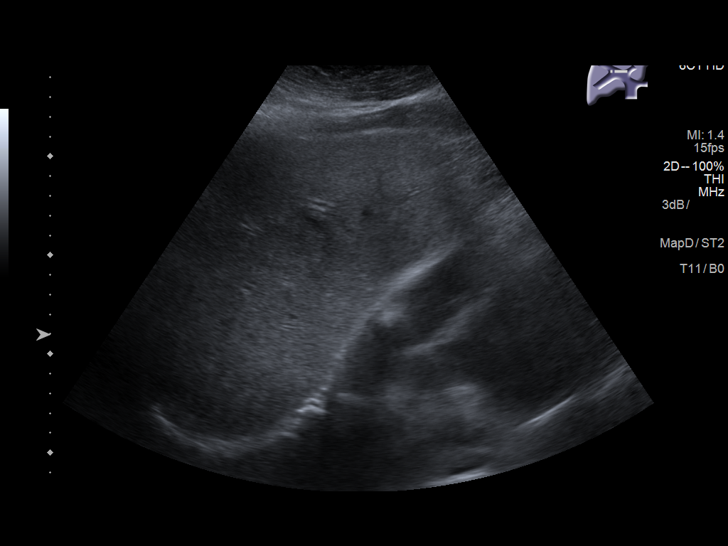
[im 51/56]
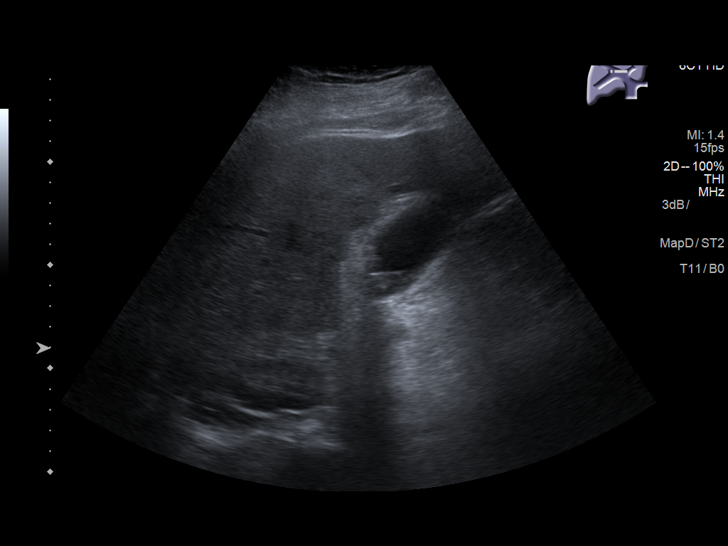
[im 56/56]
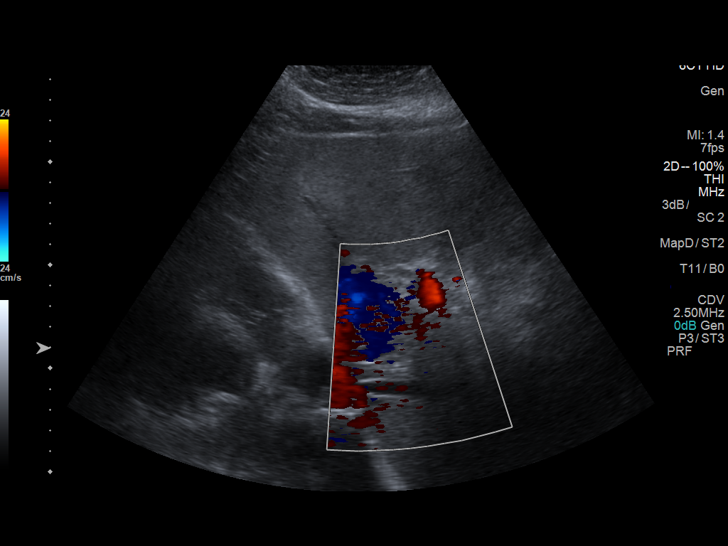

[14 of 25 positions shown; findings below may reference images not displayed]

FINDINGS: Gallbladder:

There is cholelithiasis. The gallbladder is mildly thickened
measuring 3.5 mm. A positive Murphy's sign is reported. No
pericholecystic fluid.

Common bile duct:

Diameter: 5 mm

Liver:

Hepatic steatosis. No focal mass. Portal vein is patent on color
Doppler imaging with normal direction of blood flow towards the
liver.
IMPRESSION: 1. Cholelithiasis, mild gallbladder wall thickening, and a Murphy's
sign. The findings are concerning for acute cholecystitis.
2. Hepatic steatosis.

## 2019-12-07 ENCOUNTER — Other Ambulatory Visit: Payer: Self-pay | Admitting: Obstetrics and Gynecology

## 2019-12-07 DIAGNOSIS — Z1231 Encounter for screening mammogram for malignant neoplasm of breast: Secondary | ICD-10-CM

## 2020-02-08 ENCOUNTER — Other Ambulatory Visit: Payer: Self-pay

## 2020-02-08 ENCOUNTER — Ambulatory Visit
Admission: RE | Admit: 2020-02-08 | Discharge: 2020-02-08 | Disposition: A | Discharge status: Home / Self Care | Payer: BC Managed Care – PPO | Source: Ambulatory Visit | Attending: Obstetrics and Gynecology | Admitting: Obstetrics and Gynecology

## 2020-02-08 DIAGNOSIS — Z1231 Encounter for screening mammogram for malignant neoplasm of breast: Secondary | ICD-10-CM | POA: Diagnosis present

## 2021-02-07 ENCOUNTER — Other Ambulatory Visit: Payer: Self-pay | Admitting: Obstetrics and Gynecology

## 2021-02-07 DIAGNOSIS — Z1231 Encounter for screening mammogram for malignant neoplasm of breast: Secondary | ICD-10-CM

## 2021-02-12 ENCOUNTER — Other Ambulatory Visit: Payer: Self-pay

## 2021-02-12 ENCOUNTER — Ambulatory Visit
Admission: RE | Admit: 2021-02-12 | Discharge: 2021-02-12 | Disposition: A | Discharge status: Home / Self Care | Payer: BC Managed Care – PPO | Source: Ambulatory Visit | Attending: Obstetrics and Gynecology | Admitting: Obstetrics and Gynecology

## 2021-02-12 DIAGNOSIS — Z1231 Encounter for screening mammogram for malignant neoplasm of breast: Secondary | ICD-10-CM | POA: Insufficient documentation

## 2022-07-08 ENCOUNTER — Encounter: Payer: Self-pay | Admitting: Obstetrics and Gynecology

## 2022-07-08 DIAGNOSIS — Z1231 Encounter for screening mammogram for malignant neoplasm of breast: Secondary | ICD-10-CM

## 2022-08-29 IMAGING — MG MM DIGITAL SCREENING BILAT W/ TOMO AND CAD
6 of 12 series · 6 of 36 positions shown · non-contrast
Comparison: Previous exam(s).

ACR Breast Density Category a: The breast tissue is almost entirely
fatty.

CLINICAL DATA: Screening.

EXAM:
DIGITAL SCREENING BILATERAL MAMMOGRAM WITH TOMOSYNTHESIS AND CAD
TECHNIQUE: Bilateral screening digital craniocaudal and mediolateral oblique
mammograms were obtained. Bilateral screening digital breast
tomosynthesis was performed. The images were evaluated with
computer-aided detection.

[R MLO synth-2D (1 of 2)]
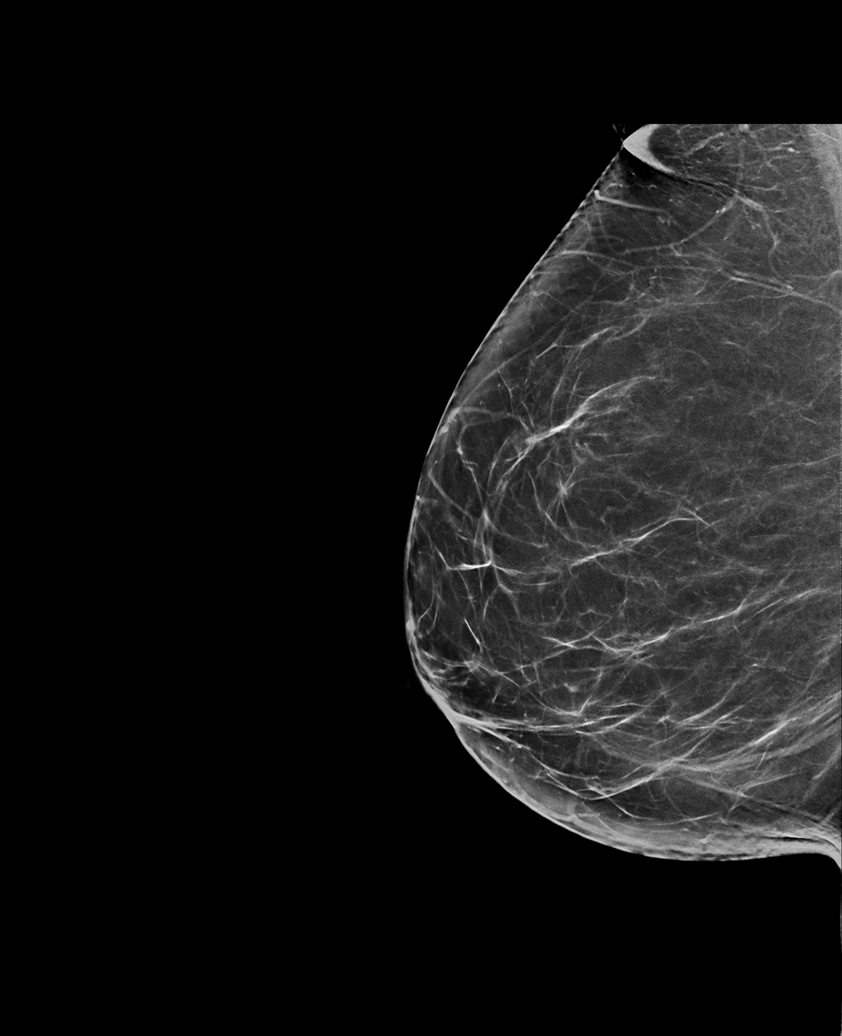

[R CC synth-2D]
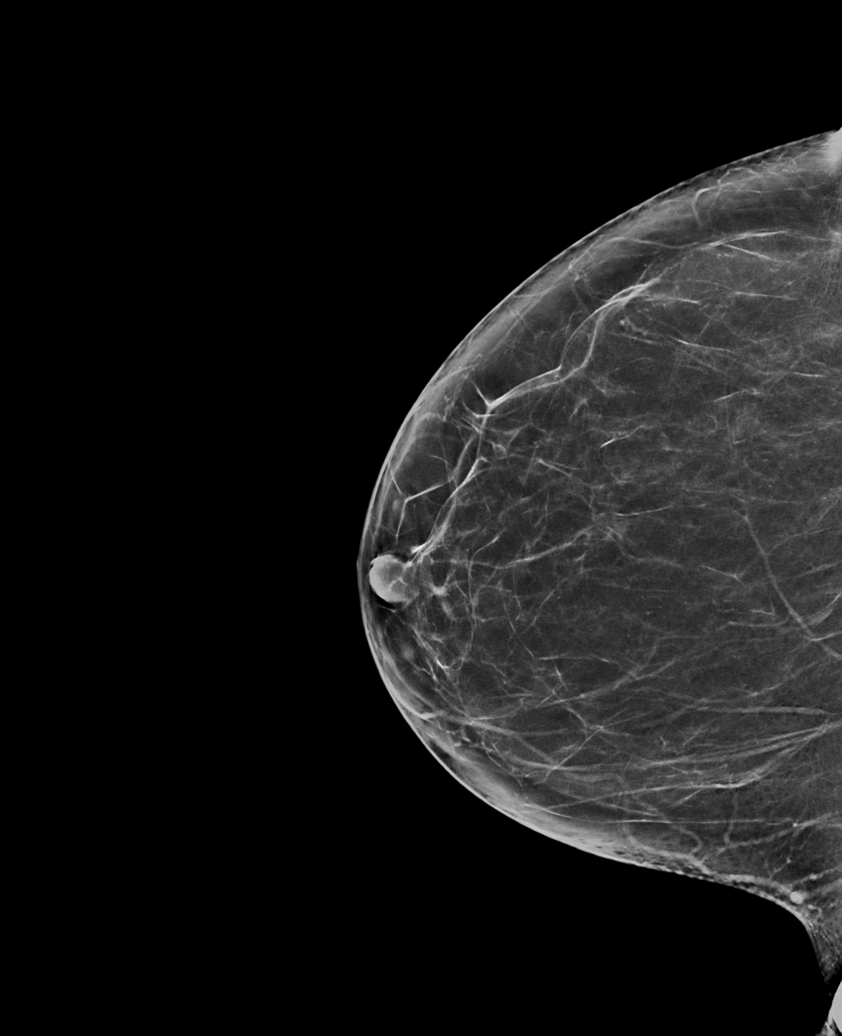

[R MLO synth-2D (2 of 2)]
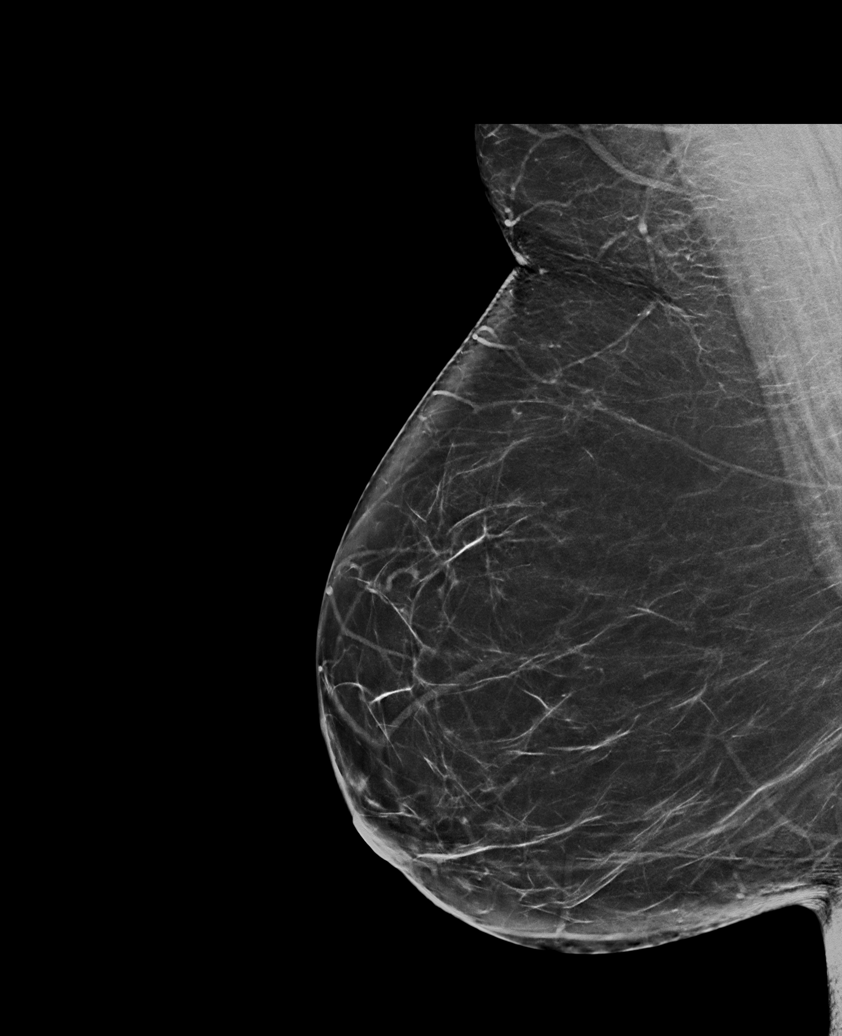

[L MLO synth-2D (1 of 2)]
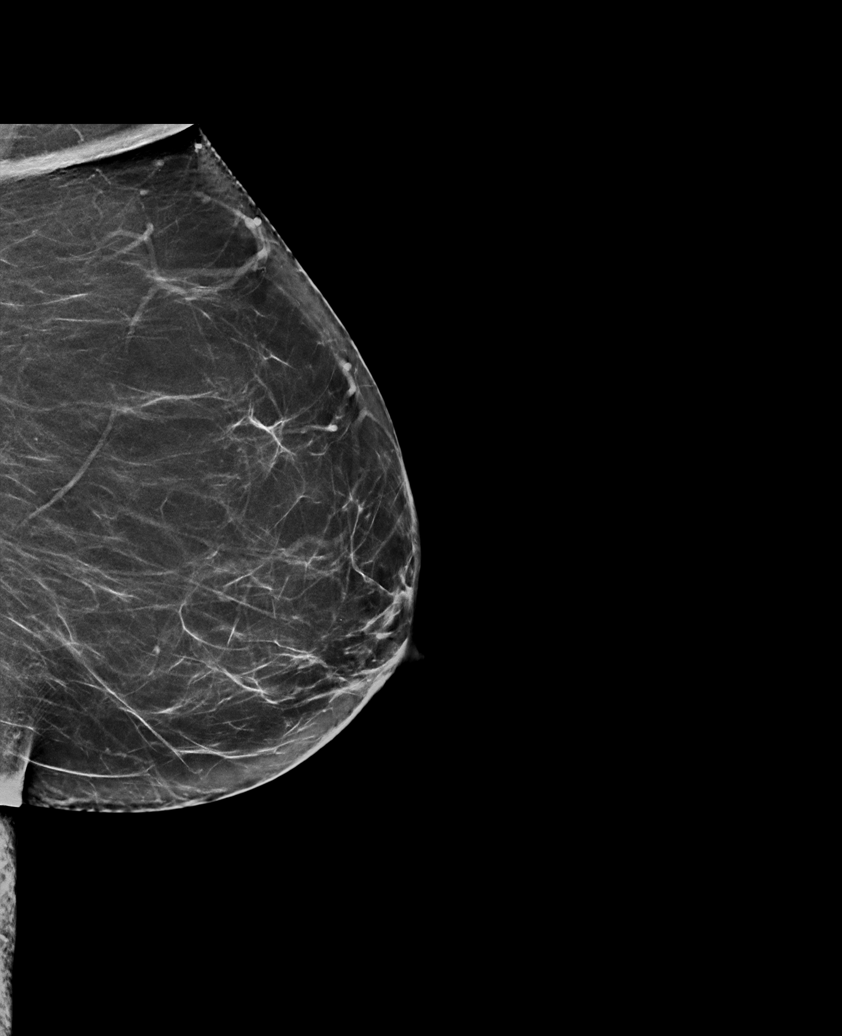

[L CC synth-2D]
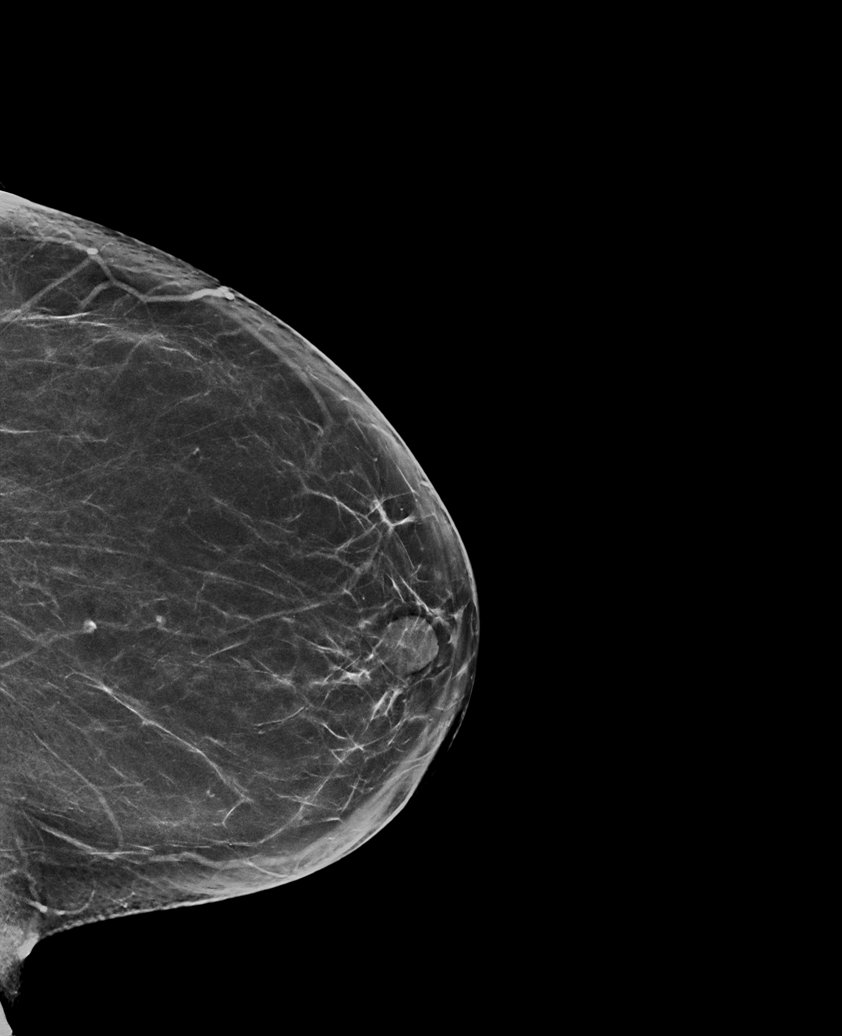

[L MLO synth-2D (2 of 2)]
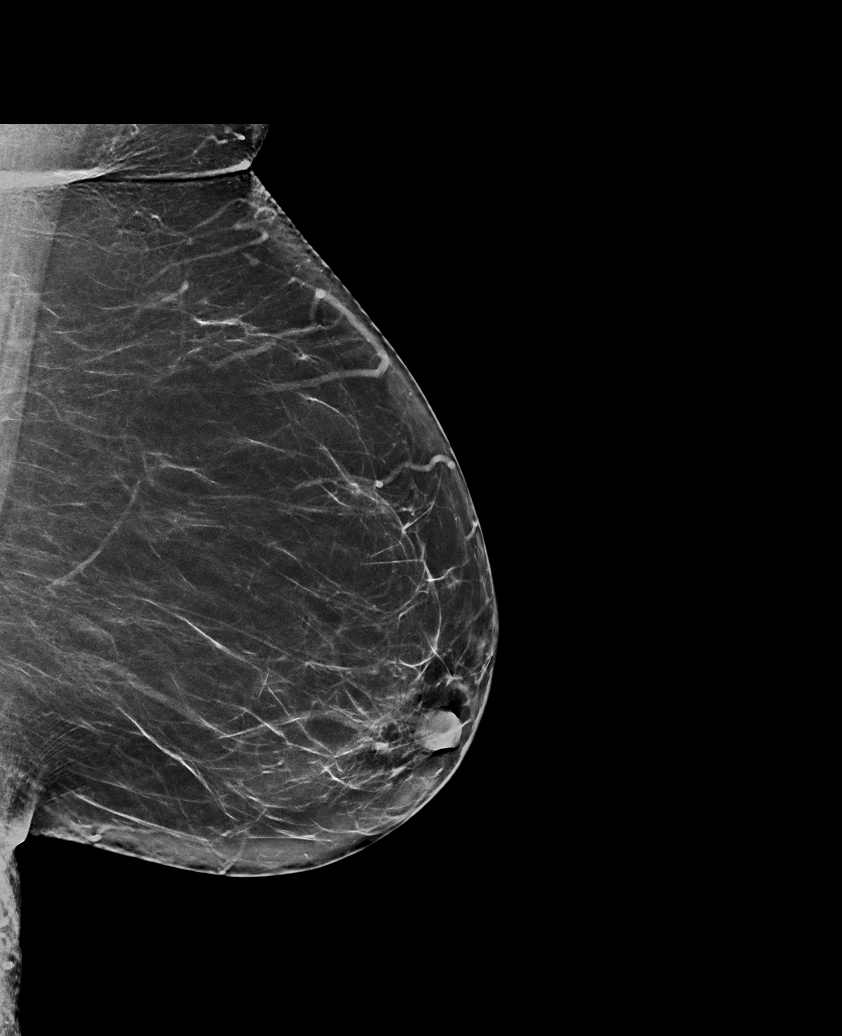

[6 of 36 positions shown; findings below may reference images not displayed]

FINDINGS: There are no findings suspicious for malignancy.
IMPRESSION: No mammographic evidence of malignancy. A result letter of this
screening mammogram will be mailed directly to the patient.

RECOMMENDATION:
Screening mammogram in one year. (Code:0E-3-N98)

BI-RADS CATEGORY  1: Negative.

## 2022-12-18 ENCOUNTER — Other Ambulatory Visit: Payer: Self-pay | Admitting: Family Medicine

## 2022-12-18 DIAGNOSIS — Z1231 Encounter for screening mammogram for malignant neoplasm of breast: Secondary | ICD-10-CM

## 2023-01-08 ENCOUNTER — Ambulatory Visit
Admission: RE | Admit: 2023-01-08 | Discharge: 2023-01-08 | Disposition: A | Discharge status: Home / Self Care | Payer: BC Managed Care – PPO | Source: Ambulatory Visit | Attending: Family Medicine | Admitting: Family Medicine

## 2023-01-08 DIAGNOSIS — Z1231 Encounter for screening mammogram for malignant neoplasm of breast: Secondary | ICD-10-CM | POA: Insufficient documentation

## 2023-06-19 ENCOUNTER — Encounter: Payer: Self-pay | Admitting: Internal Medicine

## 2023-06-25 ENCOUNTER — Encounter: Payer: Self-pay | Admitting: Internal Medicine

## 2023-07-02 ENCOUNTER — Ambulatory Visit
Admission: RE | Admit: 2023-07-02 | Discharge: 2023-07-02 | Disposition: A | Discharge status: Home / Self Care | Payer: Self-pay | Attending: Internal Medicine | Admitting: Internal Medicine

## 2023-07-02 ENCOUNTER — Encounter: Payer: Self-pay | Admitting: Internal Medicine

## 2023-07-02 ENCOUNTER — Ambulatory Visit: Admitting: Anesthesiology

## 2023-07-02 ENCOUNTER — Other Ambulatory Visit: Payer: Self-pay

## 2023-07-02 ENCOUNTER — Encounter
Admission: RE | Disposition: A | Discharge status: Home / Self Care | Payer: Self-pay | Source: Home / Self Care | Attending: Internal Medicine

## 2023-07-02 DIAGNOSIS — I10 Essential (primary) hypertension: Secondary | ICD-10-CM | POA: Diagnosis not present

## 2023-07-02 DIAGNOSIS — Z83719 Family history of colon polyps, unspecified: Secondary | ICD-10-CM | POA: Diagnosis not present

## 2023-07-02 DIAGNOSIS — K641 Second degree hemorrhoids: Secondary | ICD-10-CM | POA: Insufficient documentation

## 2023-07-02 DIAGNOSIS — Z1211 Encounter for screening for malignant neoplasm of colon: Secondary | ICD-10-CM | POA: Diagnosis present

## 2023-07-02 DIAGNOSIS — K5730 Diverticulosis of large intestine without perforation or abscess without bleeding: Secondary | ICD-10-CM | POA: Diagnosis not present

## 2023-07-02 DIAGNOSIS — Z800 Family history of malignant neoplasm of digestive organs: Secondary | ICD-10-CM | POA: Insufficient documentation

## 2023-07-02 HISTORY — PX: COLONOSCOPY: SHX5424

## 2023-07-02 SURGERY — COLONOSCOPY
Anesthesia: General

## 2023-07-02 MED ORDER — SODIUM CHLORIDE 0.9 % IV SOLN: INTRAVENOUS | Status: DC

## 2023-07-02 MED ORDER — LIDOCAINE HCL (PF) 2 % IJ SOLN
INTRAMUSCULAR | Status: AC
Start: 2023-07-02 — End: ?
  Filled 2023-07-02: qty 5

## 2023-07-02 MED ORDER — PROPOFOL 1000 MG/100ML IV EMUL
INTRAVENOUS | Status: AC
  Filled 2023-07-02: qty 100

## 2023-07-02 MED ORDER — PROPOFOL 500 MG/50ML IV EMUL
INTRAVENOUS | Status: DC | PRN
  Administered 2023-07-02: 50 ug/kg/min via INTRAVENOUS

## 2023-07-02 MED ORDER — LIDOCAINE HCL (CARDIAC) PF 100 MG/5ML IV SOSY
PREFILLED_SYRINGE | INTRAVENOUS | Status: DC | PRN
  Administered 2023-07-02: 80 mg via INTRAVENOUS

## 2023-07-02 MED ORDER — DEXMEDETOMIDINE HCL IN NACL 80 MCG/20ML IV SOLN
INTRAVENOUS | Status: AC
Start: 2023-07-02 — End: ?
  Filled 2023-07-02: qty 20

## 2023-07-02 MED ORDER — PROPOFOL 10 MG/ML IV BOLUS
INTRAVENOUS | Status: DC | PRN
  Administered 2023-07-02: 40 mg via INTRAVENOUS
  Administered 2023-07-02: 50 mg via INTRAVENOUS

## 2023-07-02 NOTE — Op Note (Signed)
 [facility] Gastroenterology Patient Name: Eileen Wu Procedure Date: 07/02/2023 7:35 AM MRN: [id number] Account #: [id number] Date of Birth: Sep 23, 1965 Admit Type: Outpatient Age: 58 Room: [facility] ENDO ROOM 3 Gender: Female Note Status: Finalized Instrument Name: Charlyn Cooley [id number] Procedure:             Colonoscopy Indications:           Colon cancer screening in patient at increased risk:                         Family history of 1st-degree relative with colon polyps Providers:             Alphonsus Jeans. [name] MD, MD Referring MD:          [name] (Referring MD) Medicines:             Propofol  per Anesthesia Complications:         No immediate complications. Estimated blood loss: None. Procedure:             Pre-Anesthesia Assessment:                        - The risks and benefits of the procedure and the                         sedation options and risks were discussed with the                         patient. All questions were answered and informed                         consent was obtained.                        - Patient identification and proposed procedure were                         verified prior to the procedure by the nurse. The                         procedure was verified in the procedure room.                        - ASA Grade Assessment: III - A patient with severe                         systemic disease.                        - After reviewing the risks and benefits, the patient                         was deemed in satisfactory condition to undergo the                         procedure.                        After obtaining informed consent, the colonoscope was  passed under direct vision. Throughout the procedure,                         the patient's blood pressure, pulse, and oxygen                         saturations were monitored continuously. The                         Colonoscope was introduced  through the anus and                         advanced to the the cecum, identified by appendiceal                         orifice and ileocecal valve. The colonoscopy was                         performed without difficulty. The patient tolerated                         the procedure well. The quality of the bowel                         preparation was good. The ileocecal valve, appendiceal                         orifice, and rectum were photographed. The ileocecal                         valve, appendiceal orifice, and rectum were                         photographed. Findings:      The perianal and digital rectal examinations were normal. Pertinent       negatives include normal sphincter tone and no palpable rectal lesions.      Many small-mouthed diverticula were found in the sigmoid colon and       descending colon. There was no evidence of diverticular bleeding.      Non-bleeding internal hemorrhoids were found during retroflexion. The       hemorrhoids were Grade II (internal hemorrhoids that prolapse but reduce       spontaneously).      The exam was otherwise without abnormality. Impression:            - Mild diverticulosis in the sigmoid colon and in the                         descending colon. There was no evidence of                         diverticular bleeding.                        - Non-bleeding internal hemorrhoids.                        - The examination was otherwise normal.                        -  No specimens collected. Recommendation:        - Patient has a contact number available for                         emergencies. The signs and symptoms of potential                         delayed complications were discussed with the patient.                         Return to normal activities tomorrow. Written                         discharge instructions were provided to the patient.                        - High fiber diet.                        - Continue  present medications.                        - Repeat colonoscopy in 5 years for screening purposes.                        - Return to my office PRN.                        - The findings and recommendations were discussed with                         the patient. Procedure Code(s):     --- Professional ---                        U2725, Colorectal cancer screening; colonoscopy on                         individual at high risk Diagnosis Code(s):     --- Professional ---                        K57.30, Diverticulosis of large intestine without                         perforation or abscess without bleeding                        K64.1, Second degree hemorrhoids                        Z83.71, Family history of colonic polyps CPT copyright 2022 American Medical Association. All rights reserved. The codes documented in this report are preliminary and upon coder review may  be revised to meet current compliance requirements. Cassie Click MD, MD 07/02/2023 8:45:43 AM This report has been signed electronically. Number of Addenda: 0 Note Initiated On: 07/02/2023 7:35 AM Scope Withdrawal Time: 0 hours 6 minutes 28 seconds  Total Procedure Duration: 0 hours 8 minutes 18 seconds  Estimated Blood Loss:  Estimated blood loss: none.      [facility]

## 2023-07-02 NOTE — Interval H&P Note (Signed)
 History and Physical Interval Note:  07/02/2023 8:27 AM  Eileen Wu  has presented today for surgery, with the diagnosis of Colon cancer screening (Z12.11) Family hx colonic polyps (Z83.719).  The various methods of treatment have been discussed with the patient and family. After consideration of risks, benefits and other options for treatment, the patient has consented to  Procedure(s): COLONOSCOPY (N/A) as a surgical intervention.  The patient's history has been reviewed, patient examined, no change in status, stable for surgery.  I have reviewed the patient's chart and labs.  Questions were answered to the patient's satisfaction.     [place], Chen Holzman

## 2023-07-02 NOTE — Anesthesia Preprocedure Evaluation (Signed)
 Anesthesia Evaluation  Patient identified by MRN, date of birth, ID band Patient awake    Reviewed: Allergy & Precautions, NPO status , Patient's Chart, lab work & pertinent test results  History of Anesthesia Complications Negative for: history of anesthetic complications  Airway Mallampati: III  TM Distance: <3 FB Neck ROM: full    Dental  (+) Chipped   Pulmonary neg pulmonary ROS, neg shortness of breath   Pulmonary exam normal        Cardiovascular Exercise Tolerance: Good hypertension, (-) angina Normal cardiovascular exam     Neuro/Psych negative neurological ROS  negative psych ROS   GI/Hepatic negative GI ROS, Neg liver ROS,neg GERD  ,,  Endo/Other  negative endocrine ROS    Renal/GU negative Renal ROS  negative genitourinary   Musculoskeletal   Abdominal   Peds  Hematology negative hematology ROS (+)   Anesthesia Other Findings Past Medical History: No date: Cancer ([facility])     Comment:  skin No date: Hypertension No date: Morbid obesity due to excess calories [facility])  Past Surgical History: No date: CHOLECYSTECTOMY 02/18/2018: COLONOSCOPY WITH PROPOFOL ; N/A     Comment:  Procedure: COLONOSCOPY WITH PROPOFOL ;  Surgeon: [place],               Alphonsus Jeans, MD;  Location: [facility] ENDOSCOPY;  Service:               Gastroenterology;  Laterality: N/A; No date: ERCP  BMI    Body Mass Index: 48.24 kg/m      Reproductive/Obstetrics negative OB ROS                             Anesthesia Physical Anesthesia Plan  ASA: 3  Anesthesia Plan: General   Post-op Pain Management:    Induction: Intravenous  PONV Risk Score and Plan: Propofol  infusion and TIVA  Airway Management Planned: Natural Airway and Nasal Cannula  Additional Equipment:   Intra-op Plan:   Post-operative Plan:   Informed Consent: I have reviewed the patients History and Physical, chart, labs and discussed the  procedure including the risks, benefits and alternatives for the proposed anesthesia with the patient or authorized representative who has indicated his/her understanding and acceptance.     Dental Advisory Given  Plan Discussed with: Anesthesiologist, CRNA and Surgeon  Anesthesia Plan Comments: (Patient consented for risks of anesthesia including but not limited to:  - adverse reactions to medications - risk of airway placement if required - damage to eyes, teeth, lips or other oral mucosa - nerve damage due to positioning  - sore throat or hoarseness - Damage to heart, brain, nerves, lungs, other parts of body or loss of life  Patient voiced understanding and assent.)       Anesthesia Quick Evaluation

## 2023-07-02 NOTE — Anesthesia Postprocedure Evaluation (Signed)
 Anesthesia Post Note  Patient: Eileen Wu  Procedure(s) Performed: COLONOSCOPY  Patient location during evaluation: Endoscopy Anesthesia Type: General Level of consciousness: awake and alert Pain management: pain level controlled Vital Signs Assessment: post-procedure vital signs reviewed and stable Respiratory status: spontaneous breathing, nonlabored ventilation, respiratory function stable and patient connected to nasal cannula oxygen Cardiovascular status: blood pressure returned to baseline and stable Postop Assessment: no apparent nausea or vomiting Anesthetic complications: no   No notable events documented.   Last Vitals:  Vitals:   07/02/23 0856 07/02/23 0906  BP: 114/86 125/89  Pulse: 77 68  Resp: 18 16  Temp:    SpO2: 97% 98%    Last Pain:  Vitals:   07/02/23 0846  TempSrc: Temporal  PainSc: 0-No pain                 Portia Brittle Elin Seats

## 2023-07-02 NOTE — H&P (Signed)
 Outpatient short stay form Pre-procedure 07/02/2023 8:27 AM Eileen Wu K. [name], M.D.  Primary Physician: [name], M.D.  Reason for visit:  Family history of colon polyps  History of present illness:   58 year old patient presenting for family history of colon cancer. Patient denies any change in bowel habits, rectal bleeding or involuntary weight loss.     Current Facility-Administered Medications:    0.9 %  sodium chloride  infusion, , Intravenous, Continuous, [place], [name], MD, Last Rate: 20 mL/hr at 07/02/23 0816, New Bag at 07/02/23 0816  Medications Prior to Admission  Medication Sig Dispense Refill Last Dose/Taking   hydrochlorothiazide (HYDRODIURIL) 25 MG tablet Take 25 mg by mouth daily.   07/01/2023   losartan (COZAAR) 25 MG tablet Take 25 mg by mouth daily.   07/01/2023   cetirizine (ZYRTEC) 10 MG tablet Take 10 mg by mouth daily.      EPINEPHrine 0.3 mg/0.3 mL IJ SOAJ injection Inject 0.3 mg into the muscle once.      meloxicam  (MOBIC ) 15 MG tablet Take 1 tablet (15 mg total) by mouth daily. (Patient not taking: Reported on 04/12/2017) 30 tablet 0 Not Taking   methocarbamol  (ROBAXIN ) 500 MG tablet Take 1 tablet (500 mg total) by mouth 4 (four) times daily. (Patient not taking: Reported on 04/12/2017) 16 tablet 0 Not Taking     Allergies  Allergen Reactions   Penicillins Hives    Has patient had a PCN reaction causing immediate rash, facial/tongue/throat swelling, SOB or lightheadedness with hypotension: Unknown Has patient had a PCN reaction causing severe rash involving mucus membranes or skin necrosis: Unknown Has patient had a PCN reaction that required hospitalization: Unknown Has patient had a PCN reaction occurring within the last 10 years: Unknown If all of the above answers are "NO", then may proceed with Cephalosporin use.    Sulfa Antibiotics      Past Medical History:  Diagnosis Date   Cancer (HCC)    skin   Hypertension    Morbid obesity due to  excess calories (HCC)     Review of systems:  Otherwise negative.    Physical Exam  Gen: Alert, oriented. Appears stated age.  HEENT: [place]/AT. PERRLA. Lungs: CTA, no wheezes. CV: RR nl S1, S2. Abd: soft, benign, no masses. BS+ Ext: No edema. Pulses 2+    Planned procedures: Proceed with colonoscopy. The patient understands the nature of the planned procedure, indications, risks, alternatives and potential complications including but not limited to bleeding, infection, perforation, damage to internal organs and possible oversedation/side effects from anesthesia. The patient agrees and gives consent to proceed.  Please refer to procedure notes for findings, recommendations and patient disposition/instructions.     [name] K. [name], M.D. Gastroenterology 07/02/2023  8:27 AM

## 2023-07-02 NOTE — Transfer of Care (Signed)
 Immediate Anesthesia Transfer of Care Note  Patient: Eileen Wu  Procedure(s) Performed: COLONOSCOPY  Patient Location: PACU  Anesthesia Type:General  Level of Consciousness: sedated  Airway & Oxygen Therapy: Patient Spontanous Breathing  Post-op Assessment: Report given to RN and Post -op Vital signs reviewed and stable  Post vital signs: Reviewed and stable  Last Vitals:  Vitals Value Taken Time  BP    Temp    Pulse    Resp    SpO2      Last Pain:  Vitals:   07/02/23 0807  TempSrc: Temporal  PainSc: 0-No pain         Complications: No notable events documented.

## 2023-07-03 ENCOUNTER — Encounter: Payer: Self-pay | Admitting: Internal Medicine
